# Patient Record
Sex: Male | Born: 1965 | Race: White | Hispanic: No | Marital: Married | Smoking: Never smoker
Health system: Southern US, Community
[De-identification: ages and names within clinical notes are randomized; demographics above are authoritative.]

## PROBLEM LIST (undated history)

## (undated) DIAGNOSIS — N200 Calculus of kidney: Secondary | ICD-10-CM

## (undated) DIAGNOSIS — N289 Disorder of kidney and ureter, unspecified: Secondary | ICD-10-CM

## (undated) HISTORY — PX: BACK SURGERY: SHX140

## (undated) HISTORY — PX: APPENDECTOMY: SHX54

## (undated) HISTORY — PX: LITHOTRIPSY: SUR834

## (undated) HISTORY — PX: TONSILLECTOMY: SUR1361

## (undated) HISTORY — PX: KNEE SURGERY: SHX244

---

## 2009-10-10 ENCOUNTER — Emergency Department (HOSPITAL_BASED_OUTPATIENT_CLINIC_OR_DEPARTMENT_OTHER): Admission: EM | Admit: 2009-10-10 | Discharge: 2009-10-10 | Payer: Self-pay | Admitting: Emergency Medicine

## 2009-10-10 ENCOUNTER — Ambulatory Visit: Payer: Self-pay | Admitting: Diagnostic Radiology

## 2013-06-05 ENCOUNTER — Encounter (HOSPITAL_BASED_OUTPATIENT_CLINIC_OR_DEPARTMENT_OTHER): Payer: Self-pay | Admitting: *Deleted

## 2013-06-05 ENCOUNTER — Emergency Department (HOSPITAL_BASED_OUTPATIENT_CLINIC_OR_DEPARTMENT_OTHER)
Admission: EM | Admit: 2013-06-05 | Discharge: 2013-06-05 | Disposition: A | Discharge status: Home / Self Care | Payer: Commercial Managed Care - PPO | Attending: Emergency Medicine | Admitting: Emergency Medicine

## 2013-06-05 DIAGNOSIS — Z87448 Personal history of other diseases of urinary system: Secondary | ICD-10-CM | POA: Insufficient documentation

## 2013-06-05 DIAGNOSIS — Y9389 Activity, other specified: Secondary | ICD-10-CM | POA: Insufficient documentation

## 2013-06-05 DIAGNOSIS — S61209A Unspecified open wound of unspecified finger without damage to nail, initial encounter: Secondary | ICD-10-CM | POA: Insufficient documentation

## 2013-06-05 DIAGNOSIS — W540XXA Bitten by dog, initial encounter: Secondary | ICD-10-CM | POA: Insufficient documentation

## 2013-06-05 DIAGNOSIS — Y929 Unspecified place or not applicable: Secondary | ICD-10-CM | POA: Insufficient documentation

## 2013-06-05 DIAGNOSIS — L089 Local infection of the skin and subcutaneous tissue, unspecified: Secondary | ICD-10-CM

## 2013-06-05 HISTORY — DX: Disorder of kidney and ureter, unspecified: N28.9

## 2013-06-05 MED ORDER — SODIUM CHLORIDE 0.9 % IV SOLN
3.0000 g | Freq: Once | INTRAVENOUS | Status: AC
  Administered 2013-06-05: 3 g via INTRAVENOUS
  Filled 2013-06-05: qty 3

## 2013-06-05 MED ORDER — AMOXICILLIN-POT CLAVULANATE 500-125 MG PO TABS: 1.0000 | ORAL_TABLET | Freq: Three times a day (TID) | ORAL | Status: AC

## 2013-06-05 NOTE — ED Provider Notes (Signed)
History    This chart was scribed for Geoffery Lyons, MD by Quintella Reichert, ED scribe.  This patient was seen in room MH03/MH03 and the patient's care was started at 7:27 PM.   CSN: [id number]  Arrival date & time 06/05/13  1816    Chief Complaint  Patient presents with  . Animal Bite    The history is provided by the patient. No language interpreter was used.     HPI Comments: Antonio Day is a 47 y.o. male who presents to the Emergency Department complaining of a dog bite to the right hand that he sustained 2 days ago.  Pt reports that his dog bit him on the hand and punctured the skin near the base of the thumb.  He notes that his dog's shots are UTD.  Pt has been taking care of the area with peroxide, antibiotic ointment and wrapping but he notes that he has developed gradually-worsening throbbing pain and swelling to the wound.  He also recently noticed a foul odor to the area.  He denies weakness or loss of motion to any of the fingers.  Pt denies fever, sore throat, SOB, CP, abdominal pain, dysuria, rash, weakness, numbness, or any other associated symptoms.  His tetanus vaccinations are UTD.      Past Medical History  Diagnosis Date  . Renal disorder     Past Surgical History  Procedure Laterality Date  . Knee surgery    . Appendectomy    . Tonsillectomy    . Back surgery    . Lithotripsy      History reviewed. No pertinent family history.   History  Substance Use Topics  . Smoking status: Never Smoker   . Smokeless tobacco: Not on file  . Alcohol Use: No     Review of Systems  All other systems reviewed and are negative.      Allergies  Morphine and related  Home Medications  No current outpatient prescriptions on file.   BP 148/86  Pulse 66  Temp(Src) 98.7 F (37.1 C) (Oral)  Resp 20  Ht 5\' 9"  (1.753 m)  Wt 220 lb (99.791 kg)  BMI 32.47 kg/m2  SpO2 96%  Physical Exam  Nursing note and vitals reviewed. Constitutional: He is oriented to  person, place, and time. He appears well-developed and well-nourished. No distress.  HENT:  Head: Normocephalic and atraumatic.  Eyes: EOM are normal.  Neck: Neck supple. No tracheal deviation present.  Cardiovascular: Normal rate.   Pulmonary/Chest: Effort normal. No respiratory distress.  Musculoskeletal: Normal range of motion.  Right thumb is noted to have an approximate 1-cm laceration to the flexor aspect.  No tendon involvement, but there is erythema around the wound extending towards the hand.  I am able to express a small amount of purulent material from the wound. He has good range of motion with minimal discomfort.  Neurological: He is alert and oriented to person, place, and time.  Skin: Skin is warm and dry.  Psychiatric: He has a normal mood and affect. His behavior is normal.    ED Course  Procedures (including critical care time)  DIAGNOSTIC STUDIES: Oxygen Saturation is 96% on room air, normal by my interpretation.    COORDINATION OF CARE: 7:32 PM-Discussed treatment plan which includes antibiotics with pt at bedside and pt agreed to plan.     Labs Reviewed - No data to display  No results found.  No diagnosis found.  MDM  There appears to some infection  in the thumb where the dog bit him.  I have given unasyn and prescribed augmentin.  He was advised to quarantine the dog for 10 days.  Last tetanus was 2 years ago.  He is to follow up with the ER if this worsens over the next two days.  He is leaving town tomorrow and insists that he must go.  If he worsens during the trip, I have advised him to go to the local ER.    I personally performed the services described in this documentation, which was scribed in my presence. The recorded information has been reviewed and is accurate.      Geoffery Lyons, MD 06/06/13 [phone]
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
## 2019-06-02 ENCOUNTER — Emergency Department (HOSPITAL_BASED_OUTPATIENT_CLINIC_OR_DEPARTMENT_OTHER): Payer: No Typology Code available for payment source

## 2019-06-02 ENCOUNTER — Other Ambulatory Visit: Payer: Self-pay

## 2019-06-02 ENCOUNTER — Encounter (HOSPITAL_BASED_OUTPATIENT_CLINIC_OR_DEPARTMENT_OTHER): Payer: Self-pay

## 2019-06-02 ENCOUNTER — Emergency Department (HOSPITAL_BASED_OUTPATIENT_CLINIC_OR_DEPARTMENT_OTHER)
Admission: EM | Admit: 2019-06-02 | Discharge: 2019-06-02 | Disposition: A | Discharge status: Home / Self Care | Payer: No Typology Code available for payment source | Attending: Emergency Medicine | Admitting: Emergency Medicine

## 2019-06-02 DIAGNOSIS — Y999 Unspecified external cause status: Secondary | ICD-10-CM | POA: Diagnosis not present

## 2019-06-02 DIAGNOSIS — Y93I9 Activity, other involving external motion: Secondary | ICD-10-CM | POA: Diagnosis not present

## 2019-06-02 DIAGNOSIS — S161XXA Strain of muscle, fascia and tendon at neck level, initial encounter: Secondary | ICD-10-CM | POA: Insufficient documentation

## 2019-06-02 DIAGNOSIS — S060X0A Concussion without loss of consciousness, initial encounter: Secondary | ICD-10-CM | POA: Insufficient documentation

## 2019-06-02 DIAGNOSIS — Y92414 Local residential or business street as the place of occurrence of the external cause: Secondary | ICD-10-CM | POA: Insufficient documentation

## 2019-06-02 DIAGNOSIS — S0990XA Unspecified injury of head, initial encounter: Secondary | ICD-10-CM | POA: Diagnosis present

## 2019-06-02 HISTORY — DX: Calculus of kidney: N20.0
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
## 2019-06-02 NOTE — Discharge Instructions (Signed)
If you develop continued, recurrent, or worsening headache, fever, neck stiffness, vomiting, blurry or double vision, weakness or numbness in your arms or legs, trouble speaking, or any other new/concerning symptoms then return to the ER for evaluation.  
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
## 2022-08-27 ENCOUNTER — Other Ambulatory Visit (HOSPITAL_BASED_OUTPATIENT_CLINIC_OR_DEPARTMENT_OTHER): Payer: Self-pay | Admitting: Student

## 2022-08-27 DIAGNOSIS — M47816 Spondylosis without myelopathy or radiculopathy, lumbar region: Secondary | ICD-10-CM

## 2022-09-03 ENCOUNTER — Ambulatory Visit (HOSPITAL_BASED_OUTPATIENT_CLINIC_OR_DEPARTMENT_OTHER)
Admission: RE | Admit: 2022-09-03 | Discharge: 2022-09-03 | Disposition: A | Discharge status: Home / Self Care | Payer: Commercial Managed Care - PPO | Source: Ambulatory Visit | Attending: Student | Admitting: Student

## 2022-09-03 DIAGNOSIS — M47816 Spondylosis without myelopathy or radiculopathy, lumbar region: Secondary | ICD-10-CM | POA: Insufficient documentation

## 2022-09-03 MED ORDER — GADOPICLENOL 0.5 MMOL/ML IV SOLN
10.0000 mL | Freq: Once | INTRAVENOUS | Status: AC | PRN
  Administered 2022-09-03: 10 mL via INTRAVENOUS
